# Patient Record
Sex: Male | Born: 1990 | Race: Black or African American | Hispanic: No | Marital: Single | Smoking: Never smoker
Health system: Southern US, Community
[De-identification: ages and names within clinical notes are randomized; demographics above are authoritative.]

---

## 2014-06-10 ENCOUNTER — Ambulatory Visit: Payer: Self-pay | Admitting: General Practice

## 2017-03-04 ENCOUNTER — Other Ambulatory Visit: Payer: Self-pay | Admitting: Family Medicine

## 2017-03-04 ENCOUNTER — Ambulatory Visit
Admission: RE | Admit: 2017-03-04 | Discharge: 2017-03-04 | Disposition: A | Discharge status: Home / Self Care | Payer: Self-pay | Source: Ambulatory Visit | Attending: Family Medicine | Admitting: Family Medicine

## 2017-03-04 ENCOUNTER — Ambulatory Visit
Admission: RE | Admit: 2017-03-04 | Discharge: 2017-03-04 | Disposition: A | Discharge status: Home / Self Care | Payer: PRIVATE HEALTH INSURANCE | Source: Ambulatory Visit | Attending: Family Medicine | Admitting: Family Medicine

## 2017-03-04 DIAGNOSIS — Z0289 Encounter for other administrative examinations: Secondary | ICD-10-CM | POA: Insufficient documentation

## 2017-03-04 DIAGNOSIS — Y9269 Other specified industrial and construction area as the place of occurrence of the external cause: Secondary | ICD-10-CM

## 2019-09-27 ENCOUNTER — Ambulatory Visit: Payer: Self-pay | Attending: Internal Medicine

## 2020-11-17 ENCOUNTER — Other Ambulatory Visit: Payer: Self-pay

## 2020-11-17 ENCOUNTER — Emergency Department: Payer: BC Managed Care – PPO

## 2020-11-17 ENCOUNTER — Emergency Department
Admission: EM | Admit: 2020-11-17 | Discharge: 2020-11-17 | Disposition: A | Discharge status: Home / Self Care | Payer: BC Managed Care – PPO | Attending: Emergency Medicine | Admitting: Emergency Medicine

## 2020-11-17 DIAGNOSIS — M545 Low back pain, unspecified: Secondary | ICD-10-CM | POA: Diagnosis not present

## 2020-11-17 DIAGNOSIS — R0602 Shortness of breath: Secondary | ICD-10-CM | POA: Diagnosis not present

## 2020-11-17 DIAGNOSIS — G8929 Other chronic pain: Secondary | ICD-10-CM | POA: Insufficient documentation

## 2020-11-17 DIAGNOSIS — R0789 Other chest pain: Secondary | ICD-10-CM | POA: Insufficient documentation

## 2020-11-17 DIAGNOSIS — R079 Chest pain, unspecified: Secondary | ICD-10-CM

## 2020-11-17 LAB — CBC
HCT: 38.8 % — ABNORMAL LOW (ref 39.0–52.0)
Hemoglobin: 13 g/dL (ref 13.0–17.0)
MCH: 31 pg (ref 26.0–34.0)
MCHC: 33.5 g/dL (ref 30.0–36.0)
MCV: 92.4 fL (ref 80.0–100.0)
Platelets: 254 10*3/uL (ref 150–400)
RBC: 4.2 MIL/uL — ABNORMAL LOW (ref 4.22–5.81)
RDW: 14.6 % (ref 11.5–15.5)
WBC: 6.3 10*3/uL (ref 4.0–10.5)
nRBC: 0.5 % — ABNORMAL HIGH (ref 0.0–0.2)

## 2020-11-17 LAB — BASIC METABOLIC PANEL
Anion gap: 6 (ref 5–15)
BUN: 11 mg/dL (ref 6–20)
CO2: 27 mmol/L (ref 22–32)
Calcium: 8.9 mg/dL (ref 8.9–10.3)
Chloride: 103 mmol/L (ref 98–111)
Creatinine, Ser: 0.88 mg/dL (ref 0.61–1.24)
GFR, Estimated: >60 mL/min (ref >60)
Glucose, Bld: 98 mg/dL (ref 70–99)
Potassium: 3.7 mmol/L (ref 3.5–5.1)
Sodium: 136 mmol/L (ref 135–145)

## 2020-11-17 LAB — TROPONIN I (HIGH SENSITIVITY)
Troponin I (High Sensitivity): <2 ng/L (ref <18)
Troponin I (High Sensitivity): 3 ng/L (ref <18)

## 2020-11-17 MED ORDER — LIDOCAINE VISCOUS HCL 2 % MT SOLN
15.0000 mL | Freq: Once | OROMUCOSAL | Status: AC
  Administered 2020-11-17: 15 mL via ORAL
  Filled 2020-11-17: qty 15

## 2020-11-17 MED ORDER — FAMOTIDINE 40 MG PO TABS: 40.0000 mg | ORAL_TABLET | Freq: Every evening | ORAL | 1 refills | Status: AC

## 2020-11-17 MED ORDER — MELOXICAM 15 MG PO TABS: 15.0000 mg | ORAL_TABLET | Freq: Every day | ORAL | 0 refills | Status: AC

## 2020-11-17 MED ORDER — ALUM & MAG HYDROXIDE-SIMETH 200-200-20 MG/5ML PO SUSP
30.0000 mL | Freq: Once | ORAL | Status: AC
  Administered 2020-11-17: 30 mL via ORAL
  Filled 2020-11-17: qty 30

## 2020-11-17 NOTE — ED Provider Notes (Signed)
[facility]-EMERGENCY DEPARTMENT  ____________________________________________  Time seen: Approximately 4:21 PM  I have reviewed the triage vital signs and the nursing notes.   HISTORY  Chief Complaint Chest Pain   Historian Patient     HPI Gregory Becker is a 30 y.o. male with a history of acid reflux, presents to the emergency department with midsternal chest pain that started this morning when patient awoke.  Patient reports that pain is exacerbated by talking.  Patient reports that sometimes the pain seems to be worse with exertion.  He reports that the pain seems to radiate to his throat and he does have some pain with swallowing.  He has no pain underneath the tongue or swelling of the throat.  He states that he has some breathlessness with "twisting positions".  He denies cough, fever or other viral URI-like symptoms.  Denies a history of cardiac issues in the past.  States that he takes no medications daily.  Patient denies family history of cardiac issues in immediate family members.  Denies new stress at work.  Patient is a daily smoker.  He denies recent travel or prolonged immobilization.  No recent surgery.   History reviewed. No pertinent past medical history.   Immunizations up to date:  Yes.     History reviewed. No pertinent past medical history.  There are no problems to display for this patient.   History reviewed. No pertinent surgical history.  Prior to Admission medications   Medication Sig Start Date End Date Taking? Authorizing Provider  famotidine (PEPCID) 40 MG tablet Take 1 tablet (40 mg total) by mouth every evening. 11/17/20 11/17/21 Yes Pia Mau M, PA-C  meloxicam (MOBIC) 15 MG tablet Take 1 tablet (15 mg total) by mouth daily for 7 days. 11/17/20 11/24/20 Yes Orvil Feil, PA-C    Allergies Patient has no known allergies.  No family history on file.  Social History Social History   Tobacco Use   Smoking status: Never   Smokeless  tobacco: Never  Substance Use Topics   Alcohol use: Yes   Drug use: Not Currently     Review of Systems  Constitutional: No fever/chills Eyes:  No discharge ENT: No upper respiratory complaints. Respiratory: no cough. No SOB/ use of accessory muscles to breath Gastrointestinal:   No nausea, no vomiting.  No diarrhea.  No constipation. Cardiac: Patient has mid-sternal chest pain.  Musculoskeletal: Negative for musculoskeletal pain.* Skin: Negative for rash, abrasions, lacerations, ecchymosis.    ____________________________________________   PHYSICAL EXAM:  VITAL SIGNS: ED Triage Vitals [11/17/20 1350]  Enc Vitals Group     BP 132/79     Pulse Rate 62     Resp 17     Temp 98.8 F (37.1 C)     Temp Source Oral     SpO2 100 %     Weight 280 lb (127 kg)     Height 5\' 11"  (1.803 m)     Head Circumference      Peak Flow      Pain Score 6     Pain Loc      Pain Edu?      Excl. in GC?      Constitutional: Alert and oriented. Well appearing and in no acute distress. Eyes: Conjunctivae are normal. PERRL. EOMI. Head: Atraumatic. ENT:      Nose: No congestion/rhinnorhea.      Mouth/Throat: Mucous membranes are moist.  Neck: No stridor.  No cervical spine tenderness to palpation. Cardiovascular: Normal  rate, regular rhythm. Normal S1 and S2.  Good peripheral circulation. Respiratory: Normal respiratory effort without tachypnea or retractions. Lungs CTAB. Good air entry to the bases with no decreased or absent breath sounds Gastrointestinal: Bowel sounds x 4 quadrants. Soft and nontender to palpation. No guarding or rigidity. No distention. Cardiac: No reproducible anterior chest wall pain. Musculoskeletal: Full range of motion to all extremities. No obvious deformities noted Neurologic:  Normal for age. No gross focal neurologic deficits are appreciated.  Skin:  Skin is warm, dry and intact. No rash noted. Psychiatric: Mood and affect are normal for age. Speech and  behavior are normal.   ____________________________________________   LABS (all labs ordered are listed, but only abnormal results are displayed)  Labs Reviewed  CBC - Abnormal; Notable for the following components:      Result Value   RBC 4.20 (*)    HCT 38.8 (*)    nRBC 0.5 (*)    All other components within normal limits  BASIC METABOLIC PANEL  TROPONIN I (HIGH SENSITIVITY)  TROPONIN I (HIGH SENSITIVITY)   ____________________________________________  EKG   ____________________________________________  RADIOLOGY Geraldo Pitter, personally viewed and evaluated these images (plain radiographs) as part of my medical decision making, as well as reviewing the written report by the radiologist.  DG Chest 2 View  Result Date: 11/17/2020 CLINICAL DATA:  Chest pain shortness of breath EXAM: CHEST - 2 VIEW COMPARISON:  03/04/2017 FINDINGS: The heart size and mediastinal contours are within normal limits. Both lungs are clear. The visualized skeletal structures are unremarkable. IMPRESSION: No active cardiopulmonary disease. Electronically Signed   By: Duanne Guess D.O.   On: 11/17/2020 14:40    ____________________________________________    PROCEDURES  Procedure(s) performed:     Procedures     Medications  alum & mag hydroxide-simeth (MAALOX/MYLANTA) 200-200-20 MG/5ML suspension 30 mL (30 mLs Oral Given 11/17/20 1637)    And  lidocaine (XYLOCAINE) 2 % viscous mouth solution 15 mL (15 mLs Oral Given 11/17/20 1637)     ____________________________________________   INITIAL IMPRESSION / ASSESSMENT AND PLAN / ED COURSE  Pertinent labs & imaging results that were available during my care of the patient were reviewed by me and considered in my medical decision making (see chart for details).      Assessment and plan Chest pain 30 year old male presents to the emergency department with midsternal chest pain that started this morning when patient  awoke.  Vital signs were reassuring at triage.  On physical exam, patient was alert, active and nontoxic-appearing.  He had no increased work of breathing or perceived breathlessness on exam.  He was able to speak in complete sentences and was managing his own secretions.  Differential diagnosis includes PE, pneumothorax, STEMI, NSTEMI, arrhythmia, GERD...  EKG indicated sinus bradycardia without ST segment elevation and with sinus arrhythmia.  Chest x-ray showed no signs of pneumothorax.  CBC and BMP were reassuring.  Initial troponin was reassuring.  Will give GI cocktail and assess for symptomatic improvement is leading diagnosis is GERD at this time.  We obtain repeat troponin to rule out NSTEMI.  Patient's PERC score of 0 decreases suspicion for PE.  Patient reported that his pain improved after GI cocktail.  We will start patient on 40 mg of Pepcid daily for acid reflux as patient is not currently taking an acid medications and also meloxicam once daily for low back pain that has been chronic for the past 2 years.  Return precautions were  given to return with new or worsening symptoms.  All patient questions were answered.     ____________________________________________  FINAL CLINICAL IMPRESSION(S) / ED DIAGNOSES  Final diagnoses:  Nonspecific chest pain  Chronic low back pain without sciatica, unspecified back pain laterality      NEW MEDICATIONS STARTED DURING THIS VISIT:  ED Discharge Orders          Ordered    meloxicam (MOBIC) 15 MG tablet  Daily        11/17/20 1743    famotidine (PEPCID) 40 MG tablet  Every evening        11/17/20 1743                This chart was dictated using voice recognition software/Dragon. Despite best efforts to proofread, errors can occur which can change the meaning. Any change was purely unintentional.     Orvil Feil, PA-C 11/17/20 1747    Sharman Cheek, MD 11/17/20 2356

## 2020-11-17 NOTE — ED Triage Notes (Signed)
Pt c/o chest pain with SOB since waking this morning, states "I get out of breath when I talk". Pt is able to speak in complete sentences with no distress noted. Pt is ambulatory to triage with a steady gait

## 2022-02-02 IMAGING — CR DG CHEST 2V
2 series · 2 of 2 positions shown · non-contrast
Comparison: 03/04/2017

CLINICAL DATA: Chest pain shortness of breath

EXAM:
CHEST - 2 VIEW

[chest pa]
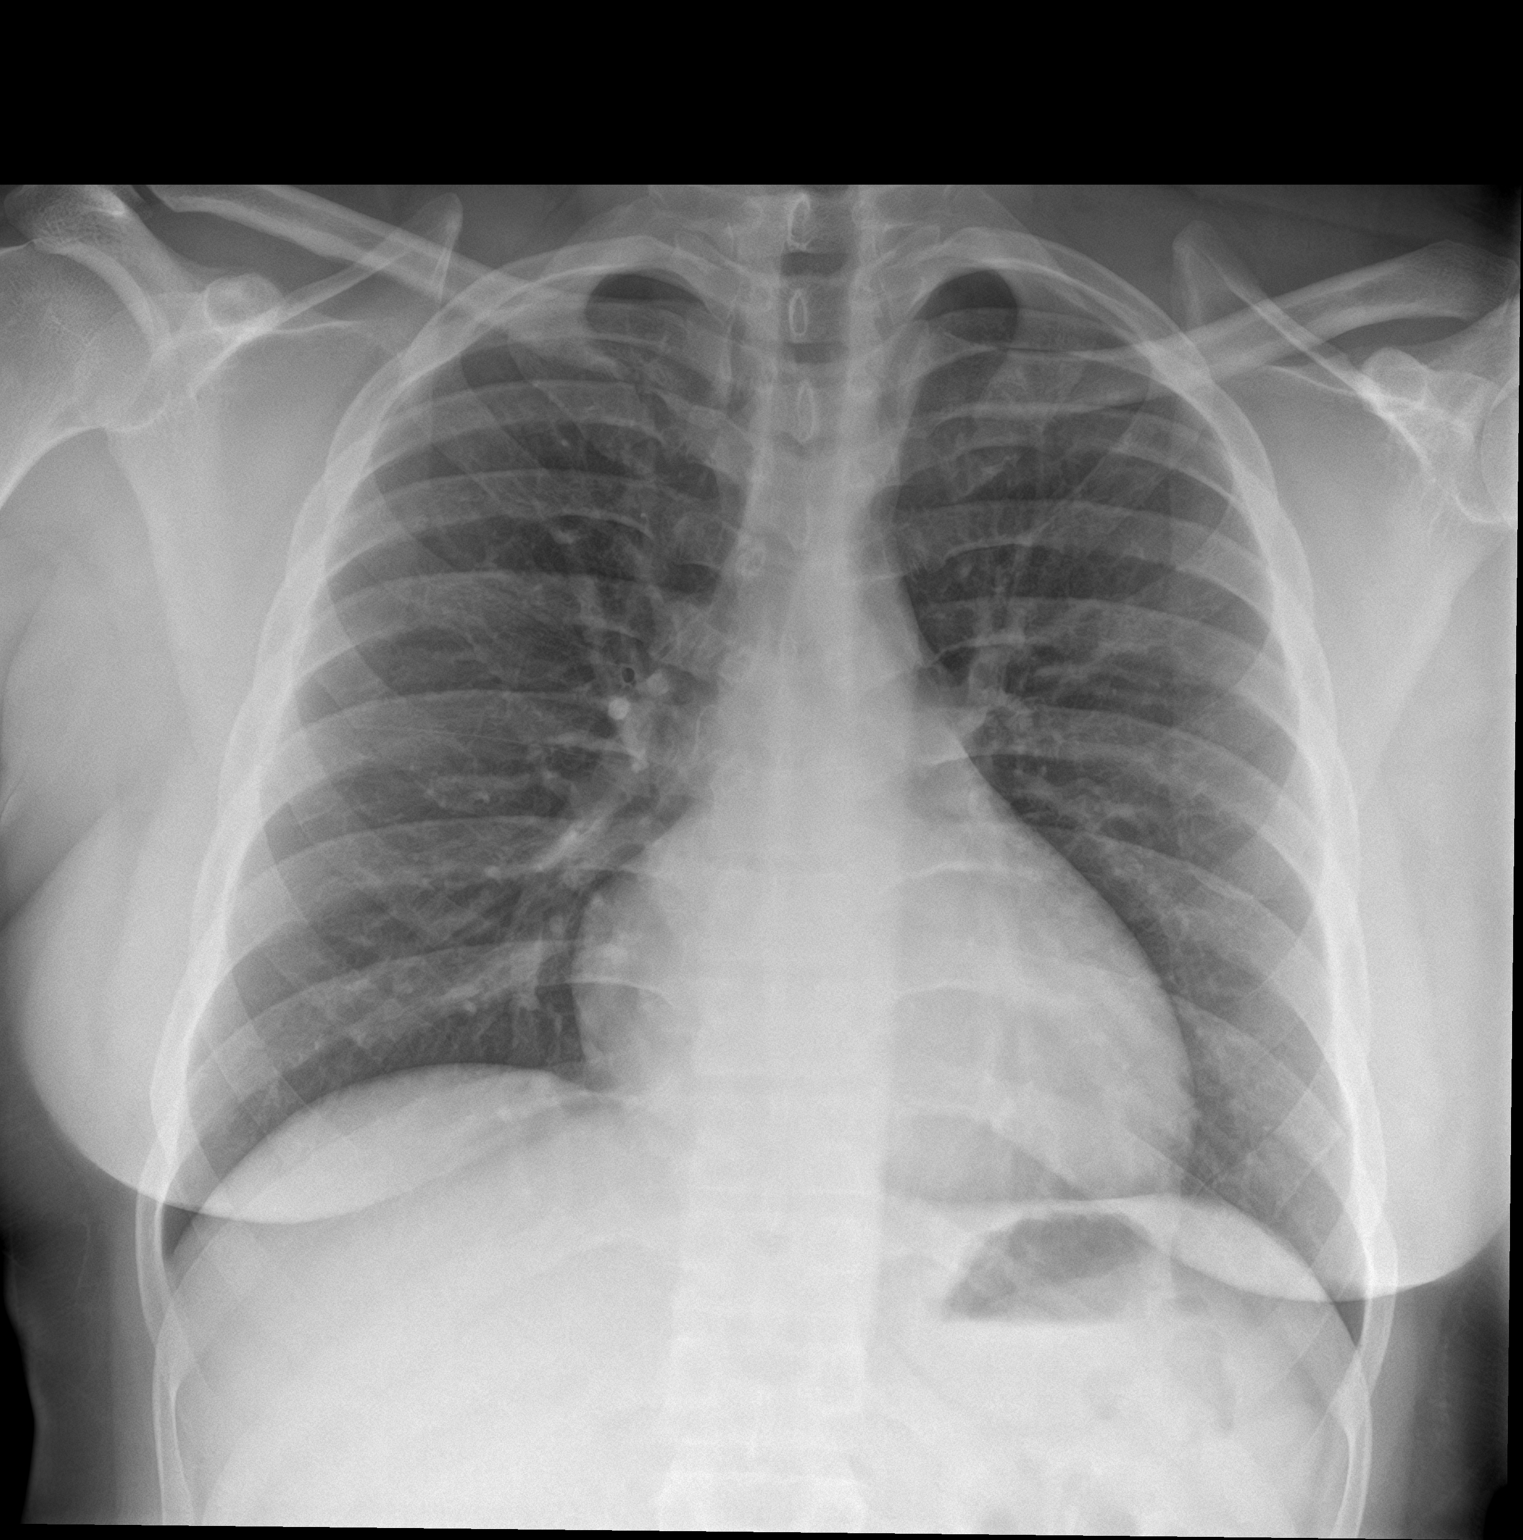

[chest lat]
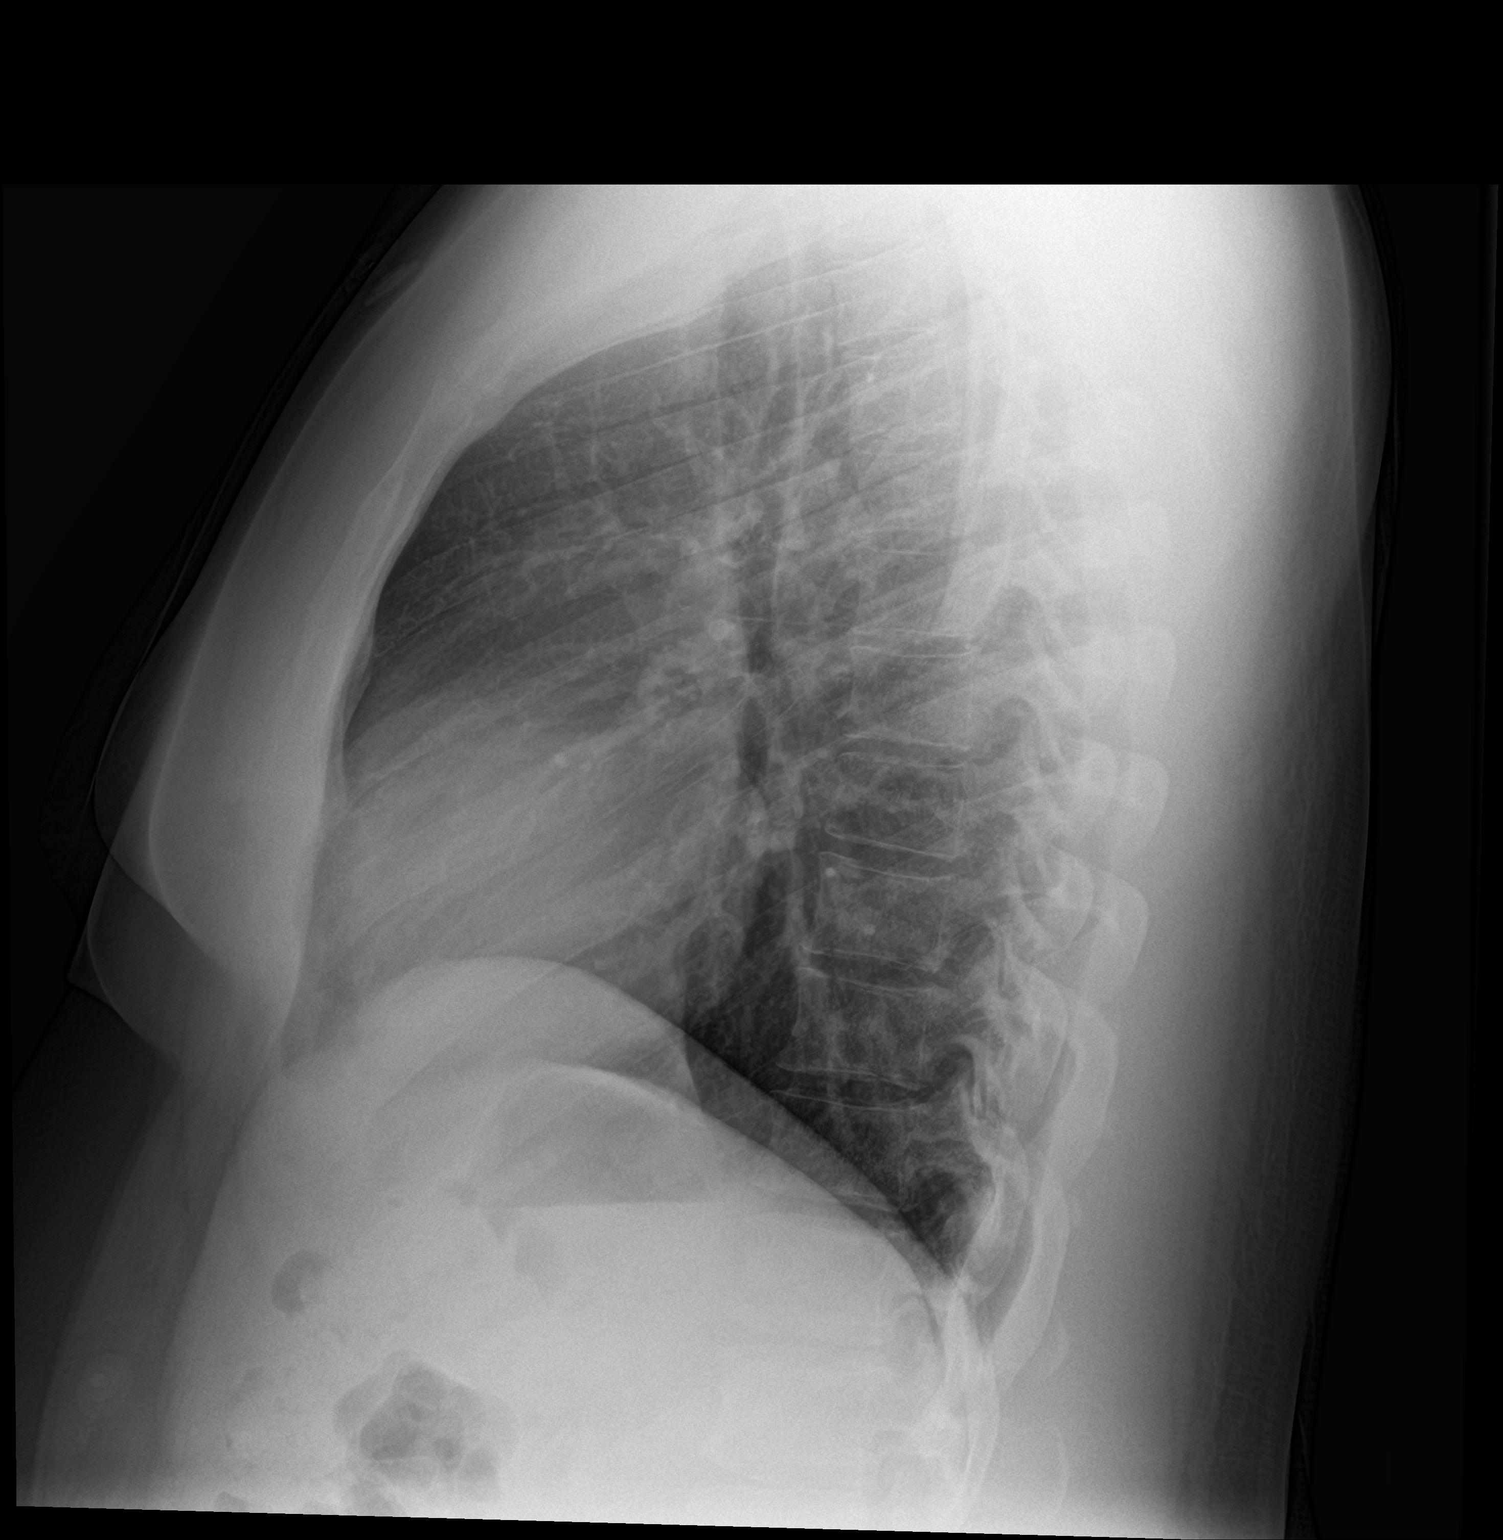

[2 of 2 positions shown; findings below may reference images not displayed]

FINDINGS: The heart size and mediastinal contours are within normal limits.
Both lungs are clear. The visualized skeletal structures are
unremarkable.
IMPRESSION: No active cardiopulmonary disease.

## 2022-06-10 DEATH — deceased
